# Patient Record
Sex: Male | Born: 1995 | Race: White | Hispanic: No | Marital: Single
Health system: Southern US, Community
[De-identification: ages and names within clinical notes are randomized; demographics above are authoritative.]

---
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
## 2019-01-11 ENCOUNTER — Other Ambulatory Visit: Payer: Self-pay | Admitting: Neurosurgery

## 2019-01-11 DIAGNOSIS — M5136 Other intervertebral disc degeneration, lumbar region: Secondary | ICD-10-CM

## 2019-01-27 ENCOUNTER — Ambulatory Visit
Admission: RE | Admit: 2019-01-27 | Discharge: 2019-01-27 | Disposition: A | Discharge status: Home / Self Care | Payer: Self-pay | Source: Ambulatory Visit | Attending: Neurosurgery | Admitting: Neurosurgery

## 2019-01-27 ENCOUNTER — Other Ambulatory Visit: Payer: Self-pay

## 2019-01-27 DIAGNOSIS — M5136 Other intervertebral disc degeneration, lumbar region: Secondary | ICD-10-CM

## 2019-06-19 ENCOUNTER — Other Ambulatory Visit: Payer: Self-pay | Admitting: Neurosurgery

## 2019-06-19 DIAGNOSIS — M5416 Radiculopathy, lumbar region: Secondary | ICD-10-CM

## 2019-06-27 ENCOUNTER — Other Ambulatory Visit (HOSPITAL_COMMUNITY): Payer: Self-pay | Admitting: Neurosurgery

## 2019-06-27 DIAGNOSIS — M5416 Radiculopathy, lumbar region: Secondary | ICD-10-CM

## 2019-07-09 ENCOUNTER — Other Ambulatory Visit: Payer: Self-pay

## 2019-07-09 ENCOUNTER — Ambulatory Visit (HOSPITAL_COMMUNITY)
Admission: RE | Admit: 2019-07-09 | Discharge: 2019-07-09 | Disposition: A | Discharge status: Home / Self Care | Payer: BC Managed Care – PPO | Source: Ambulatory Visit | Attending: Neurosurgery | Admitting: Neurosurgery

## 2019-07-09 DIAGNOSIS — M5416 Radiculopathy, lumbar region: Secondary | ICD-10-CM | POA: Insufficient documentation

## 2019-07-09 MED ORDER — GADOBUTROL 1 MMOL/ML IV SOLN
10.0000 mL | Freq: Once | INTRAVENOUS | Status: AC | PRN
  Administered 2019-07-09: 10 mL via INTRAVENOUS

## 2019-07-15 ENCOUNTER — Other Ambulatory Visit: Payer: PRIVATE HEALTH INSURANCE

## 2019-11-04 ENCOUNTER — Other Ambulatory Visit: Payer: Self-pay | Admitting: Neurosurgery

## 2019-11-04 DIAGNOSIS — M5416 Radiculopathy, lumbar region: Secondary | ICD-10-CM

## 2019-12-02 ENCOUNTER — Ambulatory Visit
Admission: RE | Admit: 2019-12-02 | Discharge: 2019-12-02 | Disposition: A | Discharge status: Home / Self Care | Payer: BC Managed Care – PPO | Source: Ambulatory Visit | Attending: Neurosurgery | Admitting: Neurosurgery

## 2019-12-02 DIAGNOSIS — M5416 Radiculopathy, lumbar region: Secondary | ICD-10-CM

## 2019-12-02 MED ORDER — GADOBENATE DIMEGLUMINE 529 MG/ML IV SOLN
20.0000 mL | Freq: Once | INTRAVENOUS | Status: AC | PRN
  Administered 2019-12-02: 20 mL via INTRAVENOUS

## 2020-12-29 ENCOUNTER — Encounter: Payer: Self-pay | Admitting: Occupational Therapy

## 2020-12-29 ENCOUNTER — Ambulatory Visit: Payer: BC Managed Care – PPO | Attending: Orthopedic Surgery | Admitting: Occupational Therapy

## 2020-12-29 DIAGNOSIS — M654 Radial styloid tenosynovitis [de Quervain]: Secondary | ICD-10-CM | POA: Diagnosis not present

## 2020-12-29 DIAGNOSIS — M25632 Stiffness of left wrist, not elsewhere classified: Secondary | ICD-10-CM | POA: Diagnosis present

## 2020-12-29 DIAGNOSIS — M25532 Pain in left wrist: Secondary | ICD-10-CM | POA: Insufficient documentation

## 2020-12-29 DIAGNOSIS — M79642 Pain in left hand: Secondary | ICD-10-CM | POA: Diagnosis present

## 2020-12-29 DIAGNOSIS — M25642 Stiffness of left hand, not elsewhere classified: Secondary | ICD-10-CM | POA: Diagnosis present

## 2020-12-29 NOTE — Therapy (Signed)
[facility] [address] [place], [address] Phone: [phone]   Fax:  [phone]  Occupational Therapy Evaluation  Patient Details  Name: Bradley Garrett MRN: [id number] Date of Birth: 11-04-1995 Referring Provider (OT): Bradley Beech PA   Encounter Date: 12/29/2020   OT End of Session - 12/29/20 1922     Visit Number 1    Number of Visits 10    Date for OT Re-Evaluation 02/09/21    OT Start Time 1034    OT Stop Time 1124    OT Time Calculation (min) 50 min    Activity Tolerance Patient tolerated treatment well    Behavior During Therapy [name] for tasks assessed/performed             History reviewed. No pertinent past medical history.  History reviewed. No pertinent surgical history.  There were no vitals filed for this visit.   Subjective Assessment - 12/29/20 1749     Subjective  I had the wrist and hand pain on and off for the last 31yrs-  the last month to 2 months worse - wearing the thumb splint    Pertinent History Bradley Garrett is a 25 y.o. male that presents at [facility] 12/18/20  for initial evaluation and management of right wrist pain.     His pain began around a month ago. The pain is located over the radial aspect of the wrist. He describes his pain as dull at rest and sharp with certain movements of the wrist. It is aggravated by lifting anything with the hand, any twisting with the wrist or gripping with the hand.He has a slight dull pain at rest today. He denies associated numbness or tingling, denies upper extremity weakness. He has tried bracing the wrist and thumb which has helped some with the thumb pain in the past. Refer to OT    Currently in Pain? 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 , play games on computer and phone , cook ,Read      AROM   Right Wrist Extension 66 Degrees    Right Wrist Flexion 90 Degrees    Right Wrist Radial Deviation 23 Degrees    Right Wrist Ulnar Deviation 30 Degrees   pain   Left Wrist Radial Deviation 35 Degrees    Left Wrist Ulnar Deviation 40 Degrees      Right Hand AROM   R Thumb MCP 0-60 40 Degrees   L 40   R Thumb IP 0-80 30 Degrees   L 80   R Thumb Radial ABduction/ADduction 0-55 52    R Thumb Palmar ABduction/ADduction 0-45 52  L 62   R Thumb Opposition to Index --   Opposition pain to 4th and 5th                     HEP contrast 2 x day  Thumb spica most all the time - off for ADL's and pain free AROM for wrist flexion, ext, RD, UD  10 reps Thumb PA and RA  And opposition pain free  10 reps  Can do ice massage over distal radius head several times during day         OT Education - 12/29/20 1922     Education Details Findings of eval, HEP and splint wearing    Person(s) Educated Patient    Methods Explanation;Demonstration;Tactile cues;Verbal cues;Handout    Comprehension Verbal cues required;Returned demonstration;Verbalized understanding                 OT Long Term Goals - 12/29/20 1928       OT LONG TERM GOAL #1   Title Pt  pain in L hand and wrist  on PRWHE decrease with more than 20 points    Baseline pain on PRWHE at eval 34/50    Time 4    Period Weeks    Status New    Target Date 01/26/21      OT LONG TERM GOAL #2   Title Pt to show increase R thumb and wrist UD AROM  to use in ADL's and  light cooking without increase symptoms    Baseline pain 2-8/10 and thumb spica more than 80% of time on - decrease UD with pain and PA - decrease thumb flexion    Time 5    Period Weeks    Status New    Target Date 02/02/21      OT LONG TERM GOAL #3   Title Pt R grip and prehension increase to more than 60% compare to L hand to wean out of splint wiithout symptoms    Baseline NT_ pain 2-8/10 - thumb spica more than 80% of time -    Time 6    Period Weeks    Status New    Target Date 02/09/21                   Plan - 12/29/20 1924     Clinical Impression Statement Pt present at eval with diagnosis of R non dominant DeQuervain tenosynovitis - pt had it on and off for 2 yrs and could usually improve with splint wearing but had pain now for about 2 months - pain 2-8/10 at R wrist and thumb - pt pt tender over distal radius head and positive Finkelstein. Pain with end range wrist flexion, ext - limited in UD and pain , as well as thumb flexion and PA decrease with pain - fabricated custom forearm base thumb spica for pt to use most all the time, do some contrast and ice massage - pain free ROM and use - request Ionto with dexamethazone - pt limited in use of L hand and wrist in ADL's and IADL's - pt can benefit from skilled OT services    OT Occupational Profile and History Problem Focused Assessment - Including review of records relating to presenting problem    Occupational performance deficits (Please refer to evaluation for details): ADL's;IADL's;Work;Play;Leisure;Social Participation    Body Structure / Function / Physical Skills ADL;Coordination;Flexibility;Decreased knowledge of precautions;IADL;ROM;UE functional use;Pain;Dexterity;Strength    Rehab Potential Fair  Clinical Decision Making Limited treatment options, no task modification necessary    Comorbidities Affecting Occupational Performance: None    Modification or Assistance to Complete Evaluation  No modification of  tasks or assist necessary to complete eval    OT Frequency 2x / week    OT Duration 6 weeks    OT Treatment/Interventions Self-care/ADL training;Manual Therapy;Patient/family education;Passive range of motion;Iontophoresis;Cryotherapy;Therapeutic exercise;Therapeutic activities;Contrast Bath;Fluidtherapy;DME and/or AE instruction;Splinting    Consulted and Agree with Plan of Care Patient             Patient will benefit from skilled therapeutic intervention in order to improve the following deficits and impairments:   Body Structure / Function / Physical Skills: ADL, Coordination, Flexibility, Decreased knowledge of precautions, IADL, ROM, UE functional use, Pain, Dexterity, Strength       Visit Diagnosis: Radial styloid tenosynovitis (de quervain) - Plan: Ot plan of care cert/re-cert  Pain in left hand - Plan: Ot plan of care cert/re-cert  Pain in left wrist - Plan: Ot plan of care cert/re-cert  Stiffness of left hand, not elsewhere classified - Plan: Ot plan of care cert/re-cert  Stiffness of left wrist, not elsewhere classified - Plan: Ot plan of care cert/re-cert    Problem List There are no problems to display for this patient.   Oletta Cohn, OTR/L, CLT 12/29/2020, 7:33 PM  [facility] PHYSICAL AND SPORTS MEDICINE [address] [place], [address] Phone: [phone]   Fax:  [phone]  Name: Bradley Garrett MRN: [id number] Date of Birth: 1996/01/15

## 2021-01-05 ENCOUNTER — Ambulatory Visit: Payer: BC Managed Care – PPO | Admitting: Occupational Therapy

## 2021-01-05 DIAGNOSIS — M25642 Stiffness of left hand, not elsewhere classified: Secondary | ICD-10-CM

## 2021-01-05 DIAGNOSIS — M79642 Pain in left hand: Secondary | ICD-10-CM

## 2021-01-05 DIAGNOSIS — M654 Radial styloid tenosynovitis [de Quervain]: Secondary | ICD-10-CM

## 2021-01-05 DIAGNOSIS — M25632 Stiffness of left wrist, not elsewhere classified: Secondary | ICD-10-CM

## 2021-01-05 DIAGNOSIS — M25532 Pain in left wrist: Secondary | ICD-10-CM

## 2021-01-05 NOTE — Therapy (Signed)
[place] [facility]INE [address] [place], [address] Phone: [phone]   Fax:  [phone]  Occupational Therapy Treatment  Patient Details  Name: Bradley Garrett MRN: [id number] Date of Birth: October 10, 1995 Referring Provider (OT): Lasandra Beech PA   Encounter Date: 01/05/2021   OT End of Session - 01/05/21 1134     Visit Number 2    Number of Visits 10    Date for OT Re-Evaluation 02/09/21    OT Start Time 1116    OT Stop Time 1200    OT Time Calculation (min) 44 min    Activity Tolerance Patient tolerated treatment well    Behavior During Therapy [facility] for tasks assessed/performed             No past medical history on file.  No past surgical history on file.  There were no vitals filed for this visit.   Subjective Assessment - 01/05/21 1130     Subjective  Splint  doing okay and pain maybe little better- but still tender , tight and burning pain at times    Pertinent History Bradley Garrett is a 25 y.o. male that presents at [facility] 12/18/20  for initial evaluation and management of right wrist pain.     His pain began around a month ago. The pain is located over the radial aspect of the wrist. He describes his pain as dull at rest and sharp with certain movements of the wrist. It is aggravated by lifting anything with the hand, any twisting with the wrist or gripping with the hand.He has a slight dull pain at rest today. He denies associated numbness or tingling, denies upper extremity weakness. He has tried bracing the wrist and thumb which has helped some with the thumb pain in the past. Refer to OT    Patient Stated Goals Want the pain better so I can use my hand and wrist like before    Currently in Pain? Yes    Pain Score 6     Pain Location Wrist    Pain Orientation Right    Pain Descriptors / Indicators Tender;Burning;Tightness    Pain Type Acute pain    Pain Onset More than a month ago                 [facility] OT Assessment - 01/05/21 0001       Right Hand AROM   R Thumb MCP 0-60 45 Degrees    R Thumb IP 0-80 75 Degrees    R Thumb Radial ABduction/ADduction 0-55 52    R Thumb Palmar ABduction/ADduction 0-45 55   pain   R Thumb Opposition to Index --   oppostiion to 5th - pain end range           Pt show increase AROM in thumb - pain with  Bradley Garrett 6/10 pain - tenderness 5/10  AROM in ranges that has pain 3/10  UD  most tight        pt ed on ionto and skin check done prior and afterwards- no issues - tolerated well    OT Treatments/Exercises (OP) - 01/05/21 0001       Iontophoresis   Type of Iontophoresis Dexamethasone    Location R distal radius    Dose med patch , 2.0 current    Time 19      RUE Contrast Bath   Time 8 minutes    Comments prior to soft tissue  and ROM               HEP contrast 2 x day to cont with  Soft tissue mobs done to webspace of thumb - tight and graston tool nr 2 for sweeping and brushing over volar and radial forearm and wrist UD AAROM stretch add at edge of table 10 reps- pain free prior to AROM  Thumb spica most all the time - off for ADL's and pain free AROM for wrist flexion, ext, RD, UD  10 reps Thumb PA and RA  And opposition pain free - an slide down 5th  10 reps  Can do ice massage over distal radius head several times during day            OT Education - 01/05/21 1133     Education Details progress and changes to HEP - ionto use    Person(s) Educated Patient    Methods Explanation;Demonstration;Tactile cues;Verbal cues;Handout    Comprehension Verbal cues required;Returned demonstration;Verbalized understanding                 OT Long Term Goals - 12/29/20 1928       OT LONG TERM GOAL #1   Title Pt  pain in L hand and wrist  on PRWHE decrease with more than 20 points    Baseline pain on PRWHE at eval 34/50    Time 4    Period Weeks    Status New    Target Date 01/26/21      OT  LONG TERM GOAL #2   Title Pt to show increase R thumb and wrist UD AROM  to use in ADL's and light cooking without increase symptoms    Baseline pain 2-8/10 and thumb spica more than 80% of time on - decrease UD with pain and PA - decrease thumb flexion    Time 5    Period Weeks    Status New    Target Date 02/02/21      OT LONG TERM GOAL #3   Title Pt R grip and prehension increase to more than 60% compare to L hand to wean out of splint wiithout symptoms    Baseline NT_ pain 2-8/10 - thumb spica more than 80% of time -    Time 6    Period Weeks    Status New    Target Date 02/09/21                   Plan - 01/05/21 1136     Clinical Impression Statement Pt present last week with diagnosis of R non dominant DeQuervain tenosynovitis - pt had it on and off for 2 yrs and could usually improve with splint wearing but had pain now for about 2 months - pain 2-8/10 at R wrist and thumb - pt pt tender over distal radius head and positive Finkelstein. Pain with end range wrist flexion, ext - limited in UD and pain , as well as thumb flexion and PA decrease with pain - fabricated custom forearm base thumb spica for pt to use most all the time, do some contrast and ice massage  Pt come in with pain 3-6/10 wiht Bradley Garrett the worse and tight in UD and thumb composite flexion- did increase thumb IP and MC flexion this date - ionto initiated this date - pt to cont with same HEP but add AAROM for UD pain free  - pain free ROM and use - pt limited in use of  L hand and wrist in ADL's and IADL's - pt can benefit from skilled OT services    OT Occupational Profile and History Problem Focused Assessment - Including review of records relating to presenting problem    Occupational performance deficits (Please refer to evaluation for details): ADL's;IADL's;Work;Play;Leisure;Social Participation    Body Structure / Function / Physical Skills ADL;Coordination;Flexibility;Decreased knowledge of  precautions;IADL;ROM;UE functional use;Pain;Dexterity;Strength    Rehab Potential Fair    Clinical Decision Making Limited treatment options, no task modification necessary    Comorbidities Affecting Occupational Performance: None    Modification or Assistance to Complete Evaluation  No modification of tasks or assist necessary to complete eval    OT Frequency 2x / week    OT Duration 6 weeks    OT Treatment/Interventions Self-care/ADL training;Manual Therapy;Patient/family education;Passive range of motion;Iontophoresis;Cryotherapy;Therapeutic exercise;Therapeutic activities;Contrast Bath;Fluidtherapy;DME and/or AE instruction;Splinting    Consulted and Agree with Plan of Care Patient             Patient will benefit from skilled therapeutic intervention in order to improve the following deficits and impairments:   Body Structure / Function / Physical Skills: ADL, Coordination, Flexibility, Decreased knowledge of precautions, IADL, ROM, UE functional use, Pain, Dexterity, Strength       Visit Diagnosis: Radial styloid tenosynovitis (de quervain)  Pain in left hand  Pain in left wrist  Stiffness of left hand, not elsewhere classified  Stiffness of left wrist, not elsewhere classified    Problem List There are no problems to display for this patient.   Bradley Garrett, Bradley Garrett,Bradley Garrett 01/05/2021, 12:19 PM  [place] [facility] PHYSICAL AND SPORTS MEDICINE [address], [place], [address] Phone: [phone]   Fax:  [phone]  Name: Bradley Garrett MRN: [id number] Date of Birth: 1995/10/22

## 2021-01-07 ENCOUNTER — Ambulatory Visit: Payer: BC Managed Care – PPO | Admitting: Occupational Therapy

## 2021-01-07 DIAGNOSIS — M25532 Pain in left wrist: Secondary | ICD-10-CM

## 2021-01-07 DIAGNOSIS — M25632 Stiffness of left wrist, not elsewhere classified: Secondary | ICD-10-CM

## 2021-01-07 DIAGNOSIS — M654 Radial styloid tenosynovitis [de Quervain]: Secondary | ICD-10-CM | POA: Diagnosis not present

## 2021-01-07 DIAGNOSIS — M25642 Stiffness of left hand, not elsewhere classified: Secondary | ICD-10-CM

## 2021-01-07 DIAGNOSIS — M79642 Pain in left hand: Secondary | ICD-10-CM

## 2021-01-07 NOTE — Therapy (Signed)
[facility] PH[facility]ORTS MEDICINE [address] [place], [address] Phone: [phone]   Fax:  [phone]  Occupational Therapy Treatment  Patient Details  Name: Bradley Garrett MRN: [id number] Date of Birth: 1996/01/26 Referring Provider (OT): Lasandra Beech PA   Encounter Date: 01/07/2021   OT End of Session - 01/07/21 1134     Visit Number 3    Number of Visits 10    Date for OT Re-Evaluation 02/09/21    OT Start Time 1128    OT Stop Time 1206    OT Time Calculation (min) 38 min             No past medical history on file.  No past surgical history on file.  There were no vitals filed for this visit.   Subjective Assessment - 01/07/21 1132     Subjective  About the same -not as tender -  I forgot the side bend exercise for the wrist    Pertinent History Bradley Garrett is a 25 y.o. male that presents at [facility] 12/18/20  for initial evaluation and management of right wrist pain.     His pain began around a month ago. The pain is located over the radial aspect of the wrist. He describes his pain as dull at rest and sharp with certain movements of the wrist. It is aggravated by lifting anything with the hand, any twisting with the wrist or gripping with the hand.He has a slight dull pain at rest today. He denies associated numbness or tingling, denies upper extremity weakness. He has tried bracing the wrist and thumb which has helped some with the thumb pain in the past. Refer to OT    Patient Stated Goals Want the pain better so I can use my hand and wrist like before    Currently in Pain? Yes    Pain Score 5    Finkelstein 5/10 ; tenderness 4/10   Pain Location Wrist    Pain Orientation Right    Pain Descriptors / Indicators Tender;Tightness    Pain Type Acute pain    Pain Onset More than a month ago                  Pt show increase AROM in thumb  and wrist without pain   Finkelstein 5/10 pain - tenderness  4/10  AROM  for wrist flexion, ext , RD and thumb PA and RA  - with no pain  UD  most tight and composite thumb flexion with wrist UD               pt ed on ionto and skin check done prior and afterwards- no issues - tolerated well           OT Treatments/Exercises (OP) - 01/07/21 0001       Iontophoresis   Type of Iontophoresis Dexamethasone    Location R distal radius    Dose med patch , 2.0 current    Time 19      RUE Contrast Bath   Time 8 minutes    Comments prior to ROM and soft tissue            HEP contrast 2 x day to cont with  Soft tissue mobs done to webspace of thumb - tight and graston tool nr 2 for sweeping and brushing over volar and radial forearm and wrist UD AAROM stretch add at edge of table 10 reps-  pain free prior to AROM - pt forgot since last time  Composite flexion of thumb PROM  Then UD with thumb ADD- PROM /AAROM  Thumb spica most all the time - off for ADL's and pain free AROM for wrist flexion, ext, RD, UD  10 reps Thumb PA and RA  And opposition pain free - an slide down 5th  10 reps  Can do ice massage over distal radius head several times during day         OT Education - 01/07/21 1134     Education Details progress and changes to HEP - ionto use    Person(s) Educated Patient    Methods Explanation;Demonstration;Tactile cues;Verbal cues;Handout    Comprehension Verbal cues required;Returned demonstration;Verbalized understanding                 OT Long Term Goals - 12/29/20 1928       OT LONG TERM GOAL #1   Title Pt  pain in L hand and wrist  on PRWHE decrease with more than 20 points    Baseline pain on PRWHE at eval 34/50    Time 4    Period Weeks    Status New    Target Date 01/26/21      OT LONG TERM GOAL #2   Title Pt to show increase R thumb and wrist UD AROM  to use in ADL's and light cooking without increase symptoms    Baseline pain 2-8/10 and thumb spica more than 80% of time on - decrease UD with  pain and PA - decrease thumb flexion    Time 5    Period Weeks    Status New    Target Date 02/02/21      OT LONG TERM GOAL #3   Title Pt R grip and prehension increase to more than 60% compare to L hand to wean out of splint wiithout symptoms    Baseline NT_ pain 2-8/10 - thumb spica more than 80% of time -    Time 6    Period Weeks    Status New    Target Date 02/09/21                   Plan - 01/07/21 1156     Clinical Impression Statement Pt present at eval with  diagnosis of R non dominant DeQuervain tenosynovitis - pt had it on and off for 2 yrs and could usually improve with splint wearing but had pain now for about 2 months - pain 2-8/10 at R wrist and thumb - pt pt tender over distal radius head and positive Finkelstein. Pain with end range wrist flexion, ext - limited in UD and pain , as well as thumb flexion and PA decrease with pain - fabricated custom forearm base thumb spica for pt to use most all the time, do some contrast and ice massage  Pt come in with pain 3-5/10 wiht Lourena Simmonds the worse and tight in UD and thumb composite flexion- did increase thumb IP and MC flexion this date - and during session had increase thumb flexion and UD - ionto  2nd session today  - pt to cont with same HEP but add AAROM for UD pain free  - pain free ROM and use - pt limited in use of L hand and wrist in ADL's and IADL's - pt can benefit from skilled OT services    OT Occupational Profile and History Problem Focused Assessment - Including review of  records relating to presenting problem    Occupational performance deficits (Please refer to evaluation for details): ADL's;IADL's;Work;Play;Leisure;Social Participation    Body Structure / Function / Physical Skills ADL;Coordination;Flexibility;Decreased knowledge of precautions;IADL;ROM;UE functional use;Pain;Dexterity;Strength    Rehab Potential Fair    Clinical Decision Making Limited treatment options, no task modification necessary     Comorbidities Affecting Occupational Performance: None    Modification or Assistance to Complete Evaluation  No modification of tasks or assist necessary to complete eval    OT Frequency 2x / week    OT Duration 6 weeks    OT Treatment/Interventions Self-care/ADL training;Manual Therapy;Patient/family education;Passive range of motion;Iontophoresis;Cryotherapy;Therapeutic exercise;Therapeutic activities;Contrast Bath;Fluidtherapy;DME and/or AE instruction;Splinting    Consulted and Agree with Plan of Care Patient             Patient will benefit from skilled therapeutic intervention in order to improve the following deficits and impairments:   Body Structure / Function / Physical Skills: ADL, Coordination, Flexibility, Decreased knowledge of precautions, IADL, ROM, UE functional use, Pain, Dexterity, Strength       Visit Diagnosis: Radial styloid tenosynovitis (de quervain)  Pain in left hand  Pain in left wrist  Stiffness of left hand, not elsewhere classified  Stiffness of left wrist, not elsewhere classified    Problem List There are no problems to display for this patient.   Oletta Cohn, OTR/L,CLT 01/07/2021, 12:08 PM   [facility] PHYSICAL AND SPORTS MEDICINE [address], [place], [address] Phone: [phone]   Fax:  [phone]  Name: WILHO SHARPLEY MRN: [id number] Date of Birth: 1996-01-03

## 2021-01-12 ENCOUNTER — Ambulatory Visit: Payer: BC Managed Care – PPO | Attending: Orthopedic Surgery | Admitting: Occupational Therapy

## 2021-01-12 DIAGNOSIS — M79642 Pain in left hand: Secondary | ICD-10-CM | POA: Diagnosis present

## 2021-01-12 DIAGNOSIS — M25642 Stiffness of left hand, not elsewhere classified: Secondary | ICD-10-CM | POA: Diagnosis present

## 2021-01-12 DIAGNOSIS — M654 Radial styloid tenosynovitis [de Quervain]: Secondary | ICD-10-CM

## 2021-01-12 DIAGNOSIS — M25532 Pain in left wrist: Secondary | ICD-10-CM | POA: Diagnosis present

## 2021-01-12 DIAGNOSIS — M25632 Stiffness of left wrist, not elsewhere classified: Secondary | ICD-10-CM

## 2021-01-12 NOTE — Therapy (Deleted)
[place] [facility] PHYSICAL [facility] SPORTS MEDICINE [address], [place], [address] Phone: [phone]   Fax:  [phone]  Occupational Therapy Treatment  Patient Details  Name: Bradley Garrett MRN: [id number] Date of Birth: 1996/03/29 Referring Provider (OT): Lasandra Beech PA   Encounter Date: 01/12/2021    No past medical history on file.  No past surgical history on file.  There were no vitals filed for this visit.                              OT Long Term Goals - 12/29/20 1928       OT LONG TERM GOAL #1   Title Pt  pain in L hand and wrist  on PRWHE decrease with more than 20 points    Baseline pain on PRWHE at eval 34/50    Time 4    Period Weeks    Status New    Target Date 01/26/21      OT LONG TERM GOAL #2   Title Pt to show increase R thumb and wrist UD AROM  to use in ADL's and light cooking without increase symptoms    Baseline pain 2-8/10 and thumb spica more than 80% of time on - decrease UD with pain and PA - decrease thumb flexion    Time 5    Period Weeks    Status New    Target Date 02/02/21      OT LONG TERM GOAL #3   Title Pt R grip and prehension increase to more than 60% compare to L hand to wean out of splint wiithout symptoms    Baseline NT_ pain 2-8/10 - thumb spica more than 80% of time -    Time 6    Period Weeks    Status New    Target Date 02/09/21                    Patient will benefit from skilled therapeutic intervention in order to improve the following deficits and impairments:           Visit Diagnosis: No diagnosis found.    Problem List There are no problems to display for this patient.   Oletta Cohn, OT/L 01/12/2021, 11:03 AM  [place] [facility] PHYSICAL AND SPORTS MEDICINE [address] [place], [address] Phone: [phone]   Fax:  [phone]  Name: Bradley Garrett MRN: [id number] Date of  Birth: [birth year]

## 2021-01-12 NOTE — Therapy (Signed)
[facility] PH[facility]INE [address], [place], [address] Phone: [phone]   Fax:  [phone]  Occupational Therapy Treatment  Patient Details  Name: Bradley Garrett MRN: [id number] Date of Birth: 1996-01-02 Referring Provider (OT): Lasandra Beech PA   Encounter Date: 01/12/2021   OT End of Session - 01/12/21 1111     Visit Number 4    Number of Visits 10    Date for OT Re-Evaluation 02/09/21    OT Start Time 1038    OT Stop Time 1119    OT Time Calculation (min) 41 min    Activity Tolerance Patient tolerated treatment well    Behavior During Therapy [facility] for tasks assessed/performed             No past medical history on file.  No past surgical history on file.  There were no vitals filed for this visit.   Subjective Assessment - 01/12/21 1110     Subjective  Doing better - did not do that wrist stretch like I should have - still tight- but feeling better    Pertinent History Bradley Garrett is a 25 y.o. male that presents at [facility] 12/18/20  for initial evaluation and management of right wrist pain.     His pain began around a month ago. The pain is located over the radial aspect of the wrist. He describes his pain as dull at rest and sharp with certain movements of the wrist. It is aggravated by lifting anything with the hand, any twisting with the wrist or gripping with the hand.He has a slight dull pain at rest today. He denies associated numbness or tingling, denies upper extremity weakness. He has tried bracing the wrist and thumb which has helped some with the thumb pain in the past. Refer to OT    Patient Stated Goals Want the pain better so I can use my hand and wrist like before    Currently in Pain? Yes    Pain Score 3     Pain Location Wrist    Pain Orientation Right    Pain Descriptors / Indicators Tender;Tightness    Pain Type Acute pain    Pain Onset More than a month ago    Pain Frequency  Intermittent                                      Pt show increase AROM in thumb  and wrist without pain   Finkelstein and distal radius head  tenderness 3/10  AROM  for wrist flexion, ext , RD and thumb PA and RA  - with no pain  UD  most tight and composite thumb flexion with wrist UD   Done some soft tissue graston on volar and radial forearm and wrist  Joint mobs to DRUJ and thumb CMC -    HEP contrast 2 x day to cont with  Soft tissue mobs done to webspace of thumb - tight and graston tool nr 2 for sweeping and brushing over volar and radial forearm and wrist UD AAROM stretch add at edge of table 10 reps- pain free prior to AROM - pt forgot since last time  Composite flexion of thumb PROM  Then UD with thumb ADD- PROM /AAROM  Thumb spica most all the time - off for ADL's and pain free AROM for wrist flexion, ext, RD,  UD  10 reps Thumb PA and RA  And opposition pain free - an slide down 5th  10 reps  Can do ice massage over distal radius head several times during day            pt ed on ionto and skin check done prior and afterwards- no issues - tolerated well        OT Treatments/Exercises (OP) - 01/12/21 0001       Iontophoresis   Type of Iontophoresis Dexamethasone    Location R distal radius    Dose med patch , 2.0 current    Time 31                    OT Education - 01/12/21 1111     Education Details progress and changes to HEP - ionto use    Person(s) Educated Patient    Methods Explanation;Demonstration;Tactile cues;Verbal cues;Handout    Comprehension Verbal cues required;Returned demonstration;Verbalized understanding                 OT Long Term Goals - 12/29/20 1928       OT LONG TERM GOAL #1   Title Pt  pain in L hand and wrist  on PRWHE decrease with more than 20 points    Baseline pain on PRWHE at eval 34/50    Time 4    Period Weeks    Status New    Target Date 01/26/21      OT LONG TERM GOAL #2    Title Pt to show increase R thumb and wrist UD AROM  to use in ADL's and light cooking without increase symptoms    Baseline pain 2-8/10 and thumb spica more than 80% of time on - decrease UD with pain and PA - decrease thumb flexion    Time 5    Period Weeks    Status New    Target Date 02/02/21      OT LONG TERM GOAL #3   Title Pt R grip and prehension increase to more than 60% compare to L hand to wean out of splint wiithout symptoms    Baseline NT_ pain 2-8/10 - thumb spica more than 80% of time -    Time 6    Period Weeks    Status New    Target Date 02/09/21                   Plan - 01/12/21 1112     Clinical Impression Statement Pt present at eval with  diagnosis of R non dominant DeQuervain tenosynovitis - pt had it on and off for 2 yrs and could usually improve with splint wearing but had pain now for about 2 months - pain 2-8/10 at R wrist and thumb - pt pt tender over distal radius head and positive Finkelstein. Pain with end range wrist flexion, ext - limited in UD and pain , as well as thumb flexion and PA decrease with pain - fabricated custom forearm base thumb spica for pt to use most all the time, do some contrast and ice massage  Pt come in with pain 3/10 wiht Finkelstein  and tenderness over distal radius - cont to be tight in UD and thumb composite flexion- did increase thumb IP and MC flexion- and during session had increase thumb flexion and UD - ionto  3rd session today  - pt to cont with same HEP but add AAROM for UD pain free  -  pain free ROM and use - pt limited in use of L hand and wrist in ADL's and IADL's - pt can benefit from skilled OT services    OT Occupational Profile and History Problem Focused Assessment - Including review of records relating to presenting problem    Occupational performance deficits (Please refer to evaluation for details): ADL's;IADL's;Work;Play;Leisure;Social Participation    Body Structure / Function / Physical Skills  ADL;Coordination;Flexibility;Decreased knowledge of precautions;IADL;ROM;UE functional use;Pain;Dexterity;Strength    Rehab Potential Fair    Clinical Decision Making Limited treatment options, no task modification necessary    Comorbidities Affecting Occupational Performance: None    Modification or Assistance to Complete Evaluation  No modification of tasks or assist necessary to complete eval    OT Frequency 2x / week    OT Duration 6 weeks    OT Treatment/Interventions Self-care/ADL training;Manual Therapy;Patient/family education;Passive range of motion;Iontophoresis;Cryotherapy;Therapeutic exercise;Therapeutic activities;Contrast Bath;Fluidtherapy;DME and/or AE instruction;Splinting    Consulted and Agree with Plan of Care Patient             Patient will benefit from skilled therapeutic intervention in order to improve the following deficits and impairments:   Body Structure / Function / Physical Skills: ADL, Coordination, Flexibility, Decreased knowledge of precautions, IADL, ROM, UE functional use, Pain, Dexterity, Strength       Visit Diagnosis: Radial styloid tenosynovitis (de quervain)  Pain in left hand  Stiffness of left hand, not elsewhere classified  Stiffness of left wrist, not elsewhere classified    Problem List There are no problems to display for this patient.   Oletta Cohn, OTR/L,CLT 01/12/2021, 11:21 AM  [facility] PHYSICAL AND SPORTS MEDICINE [address] [place], [address] Phone: [phone]   Fax:  [phone]  Name: CORMAC WINT MRN: [id number] Date of Birth: 1996-03-29

## 2021-01-12 NOTE — Therapy (Deleted)
[facility] [facility] [address] [place], [address] Phone: [phone]   Fax:  [phone]  Occupational Therapy Treatment  Patient Details  Name: RUDDY SWIRE MRN: [id number] Date of Birth: [birth year] Referring Provider (OT): Lasandra Beech PA   Encounter Date: 01/12/2021    No past medical history on file.  No past surgical history on file.  There were no vitals filed for this visit.                              OT Long Term Goals - 12/29/20 1928       OT LONG TERM GOAL #1   Title Pt  pain in L hand and wrist  on PRWHE decrease with more than 20 points    Baseline pain on PRWHE at eval 34/50    Time 4    Period Weeks    Status New    Target Date 01/26/21      OT LONG TERM GOAL #2   Title Pt to show increase R thumb and wrist UD AROM  to use in ADL's and light cooking without increase symptoms    Baseline pain 2-8/10 and thumb spica more than 80% of time on - decrease UD with pain and PA - decrease thumb flexion    Time 5    Period Weeks    Status New    Target Date 02/02/21      OT LONG TERM GOAL #3   Title Pt R grip and prehension increase to more than 60% compare to L hand to wean out of splint wiithout symptoms    Baseline NT_ pain 2-8/10 - thumb spica more than 80% of time -    Time 6    Period Weeks    Status New    Target Date 02/09/21                    Patient will benefit from skilled therapeutic intervention in order to improve the following deficits and impairments:           Visit Diagnosis: No diagnosis found.    Problem List There are no problems to display for this patient.   Oletta Cohn, OT/L 01/12/2021, 11:07 AM   [facility] PHYSICAL AND SPORTS MEDICINE [address] [place], [address] Phone: [phone]   Fax:  [phone]  Name: TONG PIECZYNSKI MRN: [id number] Date of  Birth: 12/24/1995

## 2021-01-14 ENCOUNTER — Ambulatory Visit: Payer: BC Managed Care – PPO | Admitting: Occupational Therapy

## 2021-01-14 DIAGNOSIS — M25532 Pain in left wrist: Secondary | ICD-10-CM

## 2021-01-14 DIAGNOSIS — M654 Radial styloid tenosynovitis [de Quervain]: Secondary | ICD-10-CM

## 2021-01-14 DIAGNOSIS — M25632 Stiffness of left wrist, not elsewhere classified: Secondary | ICD-10-CM

## 2021-01-14 DIAGNOSIS — M79642 Pain in left hand: Secondary | ICD-10-CM

## 2021-01-14 DIAGNOSIS — M25642 Stiffness of left hand, not elsewhere classified: Secondary | ICD-10-CM

## 2021-01-14 NOTE — Therapy (Signed)
[facility] PHYSICAL [facility]ORTS MEDICINE [address], [place], [address] Phone: [phone]   Fax:  [phone]  Occupational Therapy Treatment  Patient Details  Name: Bradley Garrett MRN: [id number] Date of Birth: 09/09/1995 Referring Provider (OT): Lasandra Beech PA   Encounter Date: 01/14/2021   OT End of Session - 01/14/21 1143     Visit Number 5    Number of Visits 10    Date for OT Re-Evaluation 02/09/21    OT Start Time 1115    OT Stop Time 1200    OT Time Calculation (min) 45 min    Activity Tolerance Patient tolerated treatment well    Behavior During Therapy [facility] for tasks assessed/performed             No past medical history on file.  No past surgical history on file.  There were no vitals filed for this visit.   Subjective Assessment - 01/14/21 1116     Subjective  Feels better- I did not realize I was tight in my wrist bending my wrist towards to pinkie    Pertinent History Bradley Garrett is a 25 y.o. male that presents at [facility] 12/18/20  for initial evaluation and management of right wrist pain.     His pain began around a month ago. The pain is located over the radial aspect of the wrist. He describes his pain as dull at rest and sharp with certain movements of the wrist. It is aggravated by lifting anything with the hand, any twisting with the wrist or gripping with the hand.He has a slight dull pain at rest today. He denies associated numbness or tingling, denies upper extremity weakness. He has tried bracing the wrist and thumb which has helped some with the thumb pain in the past. Refer to OT    Patient Stated Goals Want the pain better so I can use my hand and wrist like before    Currently in Pain? Yes    Pain Score 2     Pain Location Wrist    Pain Orientation Right    Pain Descriptors / Indicators Tender;Tightness    Pain Type Acute pain    Pain Onset More than a month ago    Pain Frequency  Occasional                [facility] OT Assessment - 01/14/21 0001       Strength   Right Hand Grip (lbs) 85    Right Hand Lateral Pinch 27 lbs    Right Hand 3 Point Pinch 20 lbs    Left Hand Grip (lbs) 120    Left Hand Lateral Pinch 27 lbs    Left Hand 3 Point Pinch 21 lbs            Grip and prehension strength assess - no pain -  AROM in thumb  and wrist without pain  except composite thumb flexion ,and RD of wrist with resistance - 1-2/10  Finkelstein negative  distal radius head  tenderness 2/10  UD  most tight and composite thumb flexion with wrist UD   Done some soft tissue graston on volar and radial forearm and wrist with some thumb MC flexion , composite thumb flexion and UD      HEP contrast 2 x day to cont with  UD AAROM stretch add at edge of table 10 reps- pain free prior to AROM  Composite flexion of thumb PROM  Then UD with thumb ADD- PROM /AAROM  Thumb spica most all the time - off for ADL's and pain free AROM for wrist flexion, ext, RD, UD  10 reps Thumb PA and RA  And opposition pain free - an slide down 5th  10 reps  Can do ice massage over distal radius head several times during day            pt ed on ionto and skin check done prior and afterwards- no issues - tolerated well           OT Treatments/Exercises (OP) - 01/14/21 0001       Iontophoresis   Type of Iontophoresis Dexamethasone    Location R distal radius    Dose med patch , 2.0 current    Time 19      RUE Contrast Bath   Time 8 minutes    Comments prior to soft tissue and stretches                    OT Education - 01/14/21 1143     Education Details progress and changes to HEP - ionto use    Person(s) Educated Patient    Methods Explanation;Demonstration;Tactile cues;Verbal cues;Handout    Comprehension Verbal cues required;Returned demonstration;Verbalized understanding                 OT Long Term Goals - 12/29/20 1928       OT LONG TERM  GOAL #1   Title Pt  pain in L hand and wrist  on PRWHE decrease with more than 20 points    Baseline pain on PRWHE at eval 34/50    Time 4    Period Weeks    Status New    Target Date 01/26/21      OT LONG TERM GOAL #2   Title Pt to show increase R thumb and wrist UD AROM  to use in ADL's and light cooking without increase symptoms    Baseline pain 2-8/10 and thumb spica more than 80% of time on - decrease UD with pain and PA - decrease thumb flexion    Time 5    Period Weeks    Status New    Target Date 02/02/21      OT LONG TERM GOAL #3   Title Pt R grip and prehension increase to more than 60% compare to L hand to wean out of splint wiithout symptoms    Baseline NT_ pain 2-8/10 - thumb spica more than 80% of time -    Time 6    Period Weeks    Status New    Target Date 02/09/21                   Plan - 01/14/21 1144     Clinical Impression Statement Pt presented at eval with  diagnosis of R non dominant DeQuervain tenosynovitis - pt had it on and off for 2 yrs and could usually improve with splint wearing, pain for about  2 months - pain 2-8/10 at R wrist and thumb - tender over distal radius head and positive Finkelstein. Pain with end range wrist flexion, ext - limited in UD and pain , as well as thumb flexion and PA decrease with pain - at eval  fabricated custom forearm base thumb spica for pt to use most all the time, do some contrast and ice massage  Today pain decrease to 2/10 with tenderness over distal radius  and FInkelstein no pain but very tight still  in UD and thumb composite flexion- no pain with wrist AROM in all planes and thumbs - Grip decrease compare to the L but prehension WNL -  did increase thumb IP and MC flexion- tightness probably from favoring and splint wearing at times over the years when ahd symptoms. Had today 4th ionto session   - pt to cont with same HEP but add AAROM for UD pain free  - pain free ROM and use - pt limited in use of L hand and  wrist in ADL's and IADL's - pt can benefit from skilled OT services    OT Occupational Profile and History Problem Focused Assessment - Including review of records relating to presenting problem    Occupational performance deficits (Please refer to evaluation for details): ADL's;IADL's;Work;Play;Leisure;Social Participation    Body Structure / Function / Physical Skills ADL;Coordination;Flexibility;Decreased knowledge of precautions;IADL;ROM;UE functional use;Pain;Dexterity;Strength    Rehab Potential Fair    Clinical Decision Making Limited treatment options, no task modification necessary    Comorbidities Affecting Occupational Performance: None    Modification or Assistance to Complete Evaluation  No modification of tasks or assist necessary to complete eval    OT Frequency 2x / week    OT Duration 6 weeks    OT Treatment/Interventions Self-care/ADL training;Manual Therapy;Patient/family education;Passive range of motion;Iontophoresis;Cryotherapy;Therapeutic exercise;Therapeutic activities;Contrast Bath;Fluidtherapy;DME and/or AE instruction;Splinting    Consulted and Agree with Plan of Care Patient             Patient will benefit from skilled therapeutic intervention in order to improve the following deficits and impairments:   Body Structure / Function / Physical Skills: ADL, Coordination, Flexibility, Decreased knowledge of precautions, IADL, ROM, UE functional use, Pain, Dexterity, Strength       Visit Diagnosis: Radial styloid tenosynovitis (de quervain)  Pain in left hand  Stiffness of left hand, not elsewhere classified  Stiffness of left wrist, not elsewhere classified  Pain in left wrist    Problem List There are no problems to display for this patient.   Bradley Garrett, OTR/L,CLT 01/14/2021, 11:49 AM  [facility] PHYSICAL AND SPORTS MEDICINE [address], [place], [address] Phone: [phone]   Fax:   [phone]  Name: Bradley Garrett MRN: [id number] Date of Birth: 02-03-1996

## 2021-01-18 ENCOUNTER — Ambulatory Visit: Payer: BC Managed Care – PPO | Admitting: Occupational Therapy

## 2021-01-18 DIAGNOSIS — M654 Radial styloid tenosynovitis [de Quervain]: Secondary | ICD-10-CM | POA: Diagnosis not present

## 2021-01-18 DIAGNOSIS — M79642 Pain in left hand: Secondary | ICD-10-CM

## 2021-01-18 DIAGNOSIS — M25532 Pain in left wrist: Secondary | ICD-10-CM

## 2021-01-18 DIAGNOSIS — M25632 Stiffness of left wrist, not elsewhere classified: Secondary | ICD-10-CM

## 2021-01-18 DIAGNOSIS — M25642 Stiffness of left hand, not elsewhere classified: Secondary | ICD-10-CM

## 2021-01-18 NOTE — Therapy (Signed)
[place] [facility] PH[facility] SPORTS MEDICINE [address], [place], [address] Phone: [phone]   Fax:  [phone]  Occupational Therapy Treatment  Patient Details  Name: Bradley Garrett MRN: [id number] Date of Birth: 06/18/1995 Referring Provider (OT): Lasandra Beech PA   Encounter Date: 01/18/2021   OT End of Session - 01/18/21 1433     Visit Number 6    Number of Visits 10    Date for OT Re-Evaluation 02/09/21    OT Start Time 1406    OT Stop Time 1450    OT Time Calculation (min) 44 min    Activity Tolerance Patient tolerated treatment well    Behavior During Therapy [facility] for tasks assessed/performed             No past medical history on file.  No past surgical history on file.  There were no vitals filed for this visit.   Subjective Assessment - 01/18/21 1411     Subjective  I do sleep with splint - but did not had it on today- this weekend did wear it- only felt my pain when I picked up pack of mountain dew    Pertinent History Bradley Garrett is a 25 y.o. male that presents at [facility] 12/18/20  for initial evaluation and management of right wrist pain.     His pain began around a month ago. The pain is located over the radial aspect of the wrist. He describes his pain as dull at rest and sharp with certain movements of the wrist. It is aggravated by lifting anything with the hand, any twisting with the wrist or gripping with the hand.He has a slight dull pain at rest today. He denies associated numbness or tingling, denies upper extremity weakness. He has tried bracing the wrist and thumb which has helped some with the thumb pain in the past. Refer to OT    Patient Stated Goals Want the pain better so I can use my hand and wrist like before    Currently in Pain? Yes    Pain Score 4     Pain Location Wrist    Pain Orientation Right    Pain Descriptors / Indicators Tender;Tightness    Pain Type Acute pain    Pain Onset More  than a month ago                          OT Treatments/Exercises (OP) - 01/18/21 0001       Moist Heat Therapy   Number Minutes Moist Heat 6 Minutes    Moist Heat Location Wrist;Hand      Iontophoresis   Type of Iontophoresis Dexamethasone    Location R distal radius    Dose med patch , 2.0 current    Time 19              HEP contrast/heat 2 x day to cont with  Soft tissue mobs done to webspace of thumb - tight  into composite thumb flexion with UD - done graston tool nr 2 for sweeping and brushing over volar and radial forearm and wrist UD AAROM stretch edge of table 10 reps- pain free prior to AROM  Then add thumb Flexion and then combo - did show some improvement - with less pain  Thumb spica most all the time - off for ADL's and pain free AROM for wrist flexion, ext, RD, UD  And take off night  time - will assess if still pain free  10 reps Thumb PA and RA  And opposition pain free - an slide down 5th  10 reps  Can do ice massage over distal radius head several times during day         OT Education - 01/18/21 1433     Education Details progress and changes to HEP - ionto use    Person(s) Educated Patient    Methods Explanation;Demonstration;Tactile cues;Verbal cues;Handout    Comprehension Verbal cues required;Returned demonstration;Verbalized understanding                 OT Long Term Goals - 12/29/20 1928       OT LONG TERM GOAL #1   Title Pt  pain in L hand and wrist  on PRWHE decrease with more than 20 points    Baseline pain on PRWHE at eval 34/50    Time 4    Period Weeks    Status New    Target Date 01/26/21      OT LONG TERM GOAL #2   Title Pt to show increase R thumb and wrist UD AROM  to use in ADL's and light cooking without increase symptoms    Baseline pain 2-8/10 and thumb spica more than 80% of time on - decrease UD with pain and PA - decrease thumb flexion    Time 5    Period Weeks    Status New    Target  Date 02/02/21      OT LONG TERM GOAL #3   Title Pt R grip and prehension increase to more than 60% compare to L hand to wean out of splint wiithout symptoms    Baseline NT_ pain 2-8/10 - thumb spica more than 80% of time -    Time 6    Period Weeks    Status New    Target Date 02/09/21                   Plan - 01/18/21 1434     Clinical Impression Statement Pt presented at eval with  diagnosis of R non dominant DeQuervain tenosynovitis - pt had it on and off for 2 yrs and could usually improve with splint wearing, pain for about  2 months - pain 2-8/10 at R wrist and thumb - tender over distal radius head and positive Finkelstein. Pain with end range wrist flexion, ext - limited in UD and pain , as well as thumb flexion and PA decrease with pain - at eval  fabricated custom forearm base thumb spica for pt to use most all the time, do some contrast and ice massage last week pain decrease to 2/10 but today increase to 4/10 - did not wear his splint today much -and report picking up pack of [place] yesterday and had some pain- Pt cont to be very  tight still  in UD and thumb composite flexion- no pain with wrist AROM in all planes and thumbs - Grip decrease compare to the L but prehension WNL -  tightness probably from favoring and splint wearing at times over the years when ahd symptoms. Had today 5th ionto session    after doing soft tissue and mobs show increase composite UD  with slight pop per pt - but less pain- pt to cont with same HEP but add AAROM for UD pain free  - pain free ROM and use - pt limited in use of L hand and wrist  in ADL's and IADL's - pt can benefit from skilled OT services    OT Occupational Profile and History Problem Focused Assessment - Including review of records relating to presenting problem    Occupational performance deficits (Please refer to evaluation for details): ADL's;IADL's;Work;Play;Leisure;Social Participation    Body Structure / Function / Physical  Skills ADL;Coordination;Flexibility;Decreased knowledge of precautions;IADL;ROM;UE functional use;Pain;Dexterity;Strength    Rehab Potential Fair    Clinical Decision Making Limited treatment options, no task modification necessary    Comorbidities Affecting Occupational Performance: None    Modification or Assistance to Complete Evaluation  No modification of tasks or assist necessary to complete eval    OT Frequency 2x / week    OT Duration 6 weeks    OT Treatment/Interventions Self-care/ADL training;Manual Therapy;Patient/family education;Passive range of motion;Iontophoresis;Cryotherapy;Therapeutic exercise;Therapeutic activities;Contrast Bath;Fluidtherapy;DME and/or AE instruction;Splinting    Consulted and Agree with Plan of Care Patient             Patient will benefit from skilled therapeutic intervention in order to improve the following deficits and impairments:   Body Structure / Function / Physical Skills: ADL, Coordination, Flexibility, Decreased knowledge of precautions, IADL, ROM, UE functional use, Pain, Dexterity, Strength       Visit Diagnosis: Radial styloid tenosynovitis (de quervain)  Pain in left hand  Stiffness of left hand, not elsewhere classified  Stiffness of left wrist, not elsewhere classified  Pain in left wrist    Problem List There are no problems to display for this patient.   Oletta Cohn, OTR/L,CLT 01/18/2021, 2:53 PM  [place] [facility] PHYSICAL AND SPORTS MEDICINE [address] [place], [address] Phone: [phone]   Fax:  [phone]  Name: Bradley Garrett MRN: [id number] Date of Birth: November 05, 1995

## 2021-01-20 ENCOUNTER — Encounter: Payer: Self-pay | Admitting: Occupational Therapy

## 2021-01-20 ENCOUNTER — Ambulatory Visit: Payer: BC Managed Care – PPO | Admitting: Occupational Therapy

## 2021-01-20 DIAGNOSIS — M25632 Stiffness of left wrist, not elsewhere classified: Secondary | ICD-10-CM

## 2021-01-20 DIAGNOSIS — M25532 Pain in left wrist: Secondary | ICD-10-CM

## 2021-01-20 DIAGNOSIS — M654 Radial styloid tenosynovitis [de Quervain]: Secondary | ICD-10-CM

## 2021-01-20 DIAGNOSIS — M79642 Pain in left hand: Secondary | ICD-10-CM

## 2021-01-20 DIAGNOSIS — M25642 Stiffness of left hand, not elsewhere classified: Secondary | ICD-10-CM

## 2021-01-20 NOTE — Therapy (Signed)
[place] [facility] PH[facility]ORTS MEDICINE [address], [place], [address] Phone: [phone]   Fax:  [phone]  Occupational Therapy Treatment  Patient Details  Name: Bradley Garrett MRN: [id number] Date of Birth: Feb 21, 1996 Referring Provider (OT): Lasandra Beech PA   Encounter Date: 01/20/2021   OT End of Session - 01/21/21 1550     Visit Number 7    Number of Visits 10    Date for OT Re-Evaluation 02/09/21    OT Start Time 1115    OT Stop Time 1205    OT Time Calculation (min) 50 min    Activity Tolerance Patient tolerated treatment well    Behavior During Therapy [facility] for tasks assessed/performed             History reviewed. No pertinent past medical history.  History reviewed. No pertinent surgical history.  There were no vitals filed for this visit.   Subjective Assessment - 01/20/21 1115     Subjective  Pt reports things are going OK, "I think the brace is helping with pain".  Did exercises under warm water and felt better at the time but was still painful afterwards. Taking care of grandparent's dog right now.  Prefers cold over heat.  Does computer work most of the day, filling out information for quality control.  Works 20 hours a week or more.  Exercises going well, has stiffness, has not been wearing brace at night in the last few nights.  Wears brace most of the day right now.  In school currently doing writing and journalism.    Pertinent History Bradley Garrett is a 25 y.o. male that presents at [facility] 12/18/20  for initial evaluation and management of right wrist pain.     His pain began around a month ago. The pain is located over the radial aspect of the wrist. He describes his pain as dull at rest and sharp with certain movements of the wrist. It is aggravated by lifting anything with the hand, any twisting with the wrist or gripping with the hand.He has a slight dull pain at rest today. He denies associated  numbness or tingling, denies upper extremity weakness. He has tried bracing the wrist and thumb which has helped some with the thumb pain in the past. Refer to OT    Patient Stated Goals Want the pain better so I can use my hand and wrist like before    Currently in Pain? Yes    Pain Score 4     Pain Location Wrist    Pain Orientation Right    Pain Descriptors / Indicators Tender;Tightness    Pain Type Acute pain    Pain Onset More than a month ago    Pain Frequency Intermittent    Multiple Pain Sites No              Contrast performed this date to left hand and wrist for 11 mins prior to therapeutic exercises and manual skills  Manual therapy with Soft tissue mobs performed to webspace of thumb, tight into composite thumb flexion with UD,  graston performed with tool nr 2 for sweeping and brushing over volar and radial forearm and wrist  UD AAROM stretch edge of table 10 reps- pain free prior to AROM  Thumb Flexion and then combo, continues to show some improvement with less pain  Thumb spica most all the time, can remove for ADL's and pain free AROM for wrist flexion, ext,  RD, UD  Remove for night time - will assess if still pain free  10 reps Thumb PA and RA  And opposition pain free, with slide down 5th to base of small finger 10 reps   Can do ice massage over distal radius head several times during day  HEP contrast/heat 2 x day            OT Treatments/Exercises (OP) - 01/21/21 0001       Moist Heat Therapy   Moist Heat Location Wrist;Hand      Iontophoresis   Type of Iontophoresis Dexamethasone    Location R distal radius    Dose med patch , 2.0 current    Time 19      RUE Contrast Bath   Time 11 minutes    Comments prior to soft tissue                    OT Education - 01/21/21 1550     Education Details changes to HEP, ionto    Person(s) Educated Patient    Methods Explanation;Demonstration;Tactile cues;Verbal cues;Handout     Comprehension Verbal cues required;Returned demonstration;Verbalized understanding                 OT Long Term Goals - 12/29/20 1928       OT LONG TERM GOAL #1   Title Pt  pain in L hand and wrist  on PRWHE decrease with more than 20 points    Baseline pain on PRWHE at eval 34/50    Time 4    Period Weeks    Status New    Target Date 01/26/21      OT LONG TERM GOAL #2   Title Pt to show increase R thumb and wrist UD AROM  to use in ADL's and light cooking without increase symptoms    Baseline pain 2-8/10 and thumb spica more than 80% of time on - decrease UD with pain and PA - decrease thumb flexion    Time 5    Period Weeks    Status New    Target Date 02/02/21      OT LONG TERM GOAL #3   Title Pt R grip and prehension increase to more than 60% compare to L hand to wean out of splint wiithout symptoms    Baseline NT_ pain 2-8/10 - thumb spica more than 80% of time -    Time 6    Period Weeks    Status New    Target Date 02/09/21                   Plan - 01/21/21 1551     Clinical Impression Statement Pt with 4/10 pain this date.   Pt cont to be very tight still  in UD and thumb composite flexion- no pain with wrist AROM in all planes and thumbs - Grip decrease compare to the L but prehension WNL. Had today 6th ionto session    after doing soft tissue and mobs show increase composite UD, but less pain- pt to cont with same HEP but add AAROM for UD pain free  - pain free ROM and use.  Pt continues to benefit from skilled OT services to maximize safety and independence in necessary daily tasks.    OT Occupational Profile and History Problem Focused Assessment - Including review of records relating to presenting problem    Occupational performance deficits (Please refer to evaluation for details): ADL's;IADL's;Work;Play;Leisure;Social  Participation    Body Structure / Function / Physical Skills ADL;Coordination;Flexibility;Decreased knowledge of  precautions;IADL;ROM;UE functional use;Pain;Dexterity;Strength    Rehab Potential Fair    Clinical Decision Making Limited treatment options, no task modification necessary    Comorbidities Affecting Occupational Performance: None    Modification or Assistance to Complete Evaluation  No modification of tasks or assist necessary to complete eval    OT Frequency 2x / week    OT Duration 6 weeks    OT Treatment/Interventions Self-care/ADL training;Manual Therapy;Patient/family education;Passive range of motion;Iontophoresis;Cryotherapy;Therapeutic exercise;Therapeutic activities;Contrast Bath;Fluidtherapy;DME and/or AE instruction;Splinting    Consulted and Agree with Plan of Care Patient             Patient will benefit from skilled therapeutic intervention in order to improve the following deficits and impairments:   Body Structure / Function / Physical Skills: ADL, Coordination, Flexibility, Decreased knowledge of precautions, IADL, ROM, UE functional use, Pain, Dexterity, Strength       Visit Diagnosis: Radial styloid tenosynovitis (de quervain)  Pain in left hand  Stiffness of left hand, not elsewhere classified  Stiffness of left wrist, not elsewhere classified  Pain in left wrist    Problem List There are no problems to display for this patient.  Bradley Garrett Bradley Garrett, OTR/L, CLT  Vint Pola, OT/L 01/21/2021, 4:03 PM  [place] [facility] PHYSICAL AND SPORTS MEDICINE [address] [place], [address] Phone: [phone]   Fax:  [phone]  Name: Bradley Garrett MRN: [id number] Date of Birth: [birth year]

## 2021-01-25 ENCOUNTER — Ambulatory Visit: Payer: BC Managed Care – PPO | Admitting: Occupational Therapy

## 2021-01-25 DIAGNOSIS — M25642 Stiffness of left hand, not elsewhere classified: Secondary | ICD-10-CM

## 2021-01-25 DIAGNOSIS — M654 Radial styloid tenosynovitis [de Quervain]: Secondary | ICD-10-CM

## 2021-01-25 DIAGNOSIS — M25532 Pain in left wrist: Secondary | ICD-10-CM

## 2021-01-25 DIAGNOSIS — M79642 Pain in left hand: Secondary | ICD-10-CM

## 2021-01-25 DIAGNOSIS — M25632 Stiffness of left wrist, not elsewhere classified: Secondary | ICD-10-CM

## 2021-01-25 NOTE — Therapy (Signed)
[place] [facility] PHYS[facility] SPORTS MEDICINE [address], [place], [address] Phone: [phone]   Fax:  [phone]  Occupational Therapy Treatment  Patient Details  Name: Bradley Garrett MRN: [id number] Date of Birth: December 26, 1995 Referring Provider (OT): Lasandra Beech PA   Encounter Date: 01/25/2021   OT End of Session - 01/25/21 1701     Visit Number 8    Number of Visits 10    Date for OT Re-Evaluation 02/09/21    OT Start Time 1630    OT Stop Time 1715    OT Time Calculation (min) 45 min    Activity Tolerance Patient tolerated treatment well    Behavior During Therapy [facility] for tasks assessed/performed             No past medical history on file.  No past surgical history on file.  There were no vitals filed for this visit.   Subjective Assessment - 01/25/21 1659     Subjective  I had my brace on for about 60% of the time - taking care of my grandparents dogs - but I would say pain is not as intense and not as often - about 50% better - did stop wearing splnt night time -but feels little more touchy    Pertinent History Bradley Garrett is a 25 y.o. male that presents at [facility] 12/18/20  for initial evaluation and management of right wrist pain.     His pain began around a month ago. The pain is located over the radial aspect of the wrist. He describes his pain as dull at rest and sharp with certain movements of the wrist. It is aggravated by lifting anything with the hand, any twisting with the wrist or gripping with the hand.He has a slight dull pain at rest today. He denies associated numbness or tingling, denies upper extremity weakness. He has tried bracing the wrist and thumb which has helped some with the thumb pain in the past. Refer to OT    Patient Stated Goals Want the pain better so I can use my hand and wrist like before    Currently in Pain? Yes    Pain Score 3     Pain Location Wrist    Pain Orientation Right     Pain Descriptors / Indicators Tender;Tightness    Pain Type Acute pain    Pain Onset More than a month ago    Pain Frequency Intermittent                   HEP contrast/heat 2 x day to cont with  Soft tissue mobs done to webspace of thumb - tight  into composite thumb flexion with UD - done graston tool nr 2 for sweeping and brushing over volar and radial forearm and wrist Thumb spica most all the time - off for ADL's and pain free AROM for wrist flexion, ext, RD, UD  Pt to wear it at night time again- feels irritated since last time stopped 10 reps Thumb PA and RA  And opposition pain free - an slide down 5th  10 reps  Can do ice massage over distal radius head several times during day      tolerate ionto well - no issues - skin check done prior - pt to keep on hour afterwards    OT Treatments/Exercises (OP) - 01/25/21 0001       Iontophoresis   Type of Iontophoresis Dexamethasone  Location R distal radius    Dose med patch , 2.0 current    Time 19      RUE Contrast Bath   Time 8 minutes    Comments prior to soft tissue mobs                    OT Education - 01/25/21 1701     Education Details changes to HEP, ionto    Person(s) Educated Patient    Methods Explanation;Demonstration;Tactile cues;Verbal cues;Handout    Comprehension Verbal cues required;Returned demonstration;Verbalized understanding                 OT Long Term Goals - 12/29/20 1928       OT LONG TERM GOAL #1   Title Pt  pain in L hand and wrist  on PRWHE decrease with more than 20 points    Baseline pain on PRWHE at eval 34/50    Time 4    Period Weeks    Status New    Target Date 01/26/21      OT LONG TERM GOAL #2   Title Pt to show increase R thumb and wrist UD AROM  to use in ADL's and light cooking without increase symptoms    Baseline pain 2-8/10 and thumb spica more than 80% of time on - decrease UD with pain and PA - decrease thumb flexion    Time 5     Period Weeks    Status New    Target Date 02/02/21      OT LONG TERM GOAL #3   Title Pt R grip and prehension increase to more than 60% compare to L hand to wean out of splint wiithout symptoms    Baseline NT_ pain 2-8/10 - thumb spica more than 80% of time -    Time 6    Period Weeks    Status New    Target Date 02/09/21                   Plan - 01/25/21 1701     Clinical Impression Statement Pt with 2-4/10 pain this date stopped wearing thumb spica at night time - feels like will do better wearing it - pain about 50% better - no as intense or constant per pt.  Pt cont to be very tight still  in UD and thumb composite flexion-  but could be because of years having this issue and favoring it - Pain with resistance end range thumb PA and RA - but 1/10. Pt today with 7th session of ionto-  after doing soft tissue and mobs show increase composite UD, but less pain- pt to cont with same HEP but add AAROM for UD pain free  - pain free ROM and use.  Pt continues to benefit from skilled OT services to maximize safety and independence in necessary daily tasks.    OT Occupational Profile and History Problem Focused Assessment - Including review of records relating to presenting problem    Occupational performance deficits (Please refer to evaluation for details): ADL's;IADL's;Work;Play;Leisure;Social Participation    Body Structure / Function / Physical Skills ADL;Coordination;Flexibility;Decreased knowledge of precautions;IADL;ROM;UE functional use;Pain;Dexterity;Strength    Rehab Potential Fair    Clinical Decision Making Limited treatment options, no task modification necessary    Comorbidities Affecting Occupational Performance: None    Modification or Assistance to Complete Evaluation  No modification of tasks or assist necessary to complete eval    OT Frequency 2x /  week    OT Duration 4 weeks    OT Treatment/Interventions Self-care/ADL training;Manual Therapy;Patient/family  education;Passive range of motion;Iontophoresis;Cryotherapy;Therapeutic exercise;Therapeutic activities;Contrast Bath;Fluidtherapy;DME and/or AE instruction;Splinting    Consulted and Agree with Plan of Care Patient             Patient will benefit from skilled therapeutic intervention in order to improve the following deficits and impairments:   Body Structure / Function / Physical Skills: ADL, Coordination, Flexibility, Decreased knowledge of precautions, IADL, ROM, UE functional use, Pain, Dexterity, Strength       Visit Diagnosis: Radial styloid tenosynovitis (de quervain)  Pain in left hand  Stiffness of left hand, not elsewhere classified  Stiffness of left wrist, not elsewhere classified  Pain in left wrist    Problem List There are no problems to display for this patient.   Oletta Cohn, OTR/L,CLT 01/25/2021, 5:04 PM  [place] [facility] PHYSICAL AND SPORTS MEDICINE [address], [place], [address] Phone: [phone]   Fax:  [phone]  Name: NEVILLE WALSTON MRN: [id number] Date of Birth: March 28, 1996

## 2021-01-27 ENCOUNTER — Ambulatory Visit: Payer: BC Managed Care – PPO | Admitting: Occupational Therapy

## 2021-01-27 DIAGNOSIS — M25642 Stiffness of left hand, not elsewhere classified: Secondary | ICD-10-CM

## 2021-01-27 DIAGNOSIS — M25532 Pain in left wrist: Secondary | ICD-10-CM

## 2021-01-27 DIAGNOSIS — M79642 Pain in left hand: Secondary | ICD-10-CM

## 2021-01-27 DIAGNOSIS — M654 Radial styloid tenosynovitis [de Quervain]: Secondary | ICD-10-CM

## 2021-01-27 DIAGNOSIS — M25632 Stiffness of left wrist, not elsewhere classified: Secondary | ICD-10-CM

## 2021-01-27 NOTE — Therapy (Signed)
[facility] PHYSICAL AND SPORTS MEDICINE [address], [place], [address] Phone: [phone]   Fax:  [phone]  Occupational Therapy Treatment  Patient Details  Name: Bradley Garrett MRN: [id number] Date of Birth: 1995-11-28 Referring Provider (OT): Lasandra Beech PA   Encounter Date: 01/27/2021   OT End of Session - 01/27/21 1921     Visit Number 9    Number of Visits 10    Date for OT Re-Evaluation 02/09/21    OT Start Time 1615    OT Stop Time 1709    OT Time Calculation (min) 54 min    Activity Tolerance Patient tolerated treatment well    Behavior During Therapy [facility] for tasks assessed/performed             No past medical history on file.  No past surgical history on file.  There were no vitals filed for this visit.   Subjective Assessment - 01/27/21 1918     Subjective  Doing okay - about same as last time- when I turn doorknob certain way - or packing and carrying my stuff when I dog sat for grandparents - felt tome pain but not more than 4/10 - about 50% better- I never realize my wrist is tight going that direction - do feel pop at times    Pertinent History Bradley Garrett is a 25 y.o. male that presents at [facility] 12/18/20  for initial evaluation and management of right wrist pain.     His pain began around a month ago. The pain is located over the radial aspect of the wrist. He describes his pain as dull at rest and sharp with certain movements of the wrist. It is aggravated by lifting anything with the hand, any twisting with the wrist or gripping with the hand.He has a slight dull pain at rest today. He denies associated numbness or tingling, denies upper extremity weakness. He has tried bracing the wrist and thumb which has helped some with the thumb pain in the past. Refer to OT    Patient Stated Goals Want the pain better so I can use my hand and wrist like before    Currently in Pain? Yes    Pain Score 2    4  with Lourena Simmonds - but tight   Pain Location Wrist    Pain Orientation Right    Pain Descriptors / Indicators Tender;Tightness    Pain Type Acute pain    Pain Onset More than a month ago    Pain Frequency Intermittent                 cont this date pre and post heat assessment of UD and composite UD with thumb flexion -  32 degrees for UD , but if add thumb flexion 12 degrees without heat and 15 after heat Soft tissue mobs done to webspace of thumb - tight  into composite thumb flexion with UD - done graston tool nr 2 for sweeping and brushing over volar and radial forearm and wrist Thumb spica most all the time -but pt fit with wrist and thumb wrap and pt to start weaning over the next 10 days into wrist wrap 2hrs day then 3 and then 4 hrs - alternating with thumb spica  Cont with same AROM and PROM HEP        OT Treatments/Exercises (OP) - 01/27/21 0001       Moist Heat Therapy   Number Minutes Moist  Heat 6 Minutes    Moist Heat Location Wrist;Hand   prior to UD and composite UD stretch     Iontophoresis   Type of Iontophoresis Dexamethasone    Location 1st dorsal compartment    Dose med patch , 2.0 current    Time 19            skin check done prior and pt to keep patch on four hour afterwards - pt tolerated well ionto        OT Education - 01/27/21 1921     Education Details changes to HEP, ionto    Person(s) Educated Patient    Methods Explanation;Demonstration;Tactile cues;Verbal cues;Handout    Comprehension Verbal cues required;Returned demonstration;Verbalized understanding                 OT Long Term Goals - 01/27/21 1930       OT LONG TERM GOAL #1   Title Pt  pain in L hand and wrist  on PRWHE decrease with more than 20 points    Baseline pain on PRWHE at eval 34/50  NOW - pain decrease to 17/50    Time 2    Period Weeks    Status On-going    Target Date 02/09/21      OT LONG TERM GOAL #2   Title Pt to show increase R thumb  and wrist UD AROM  to use in ADL's and light cooking without increase symptoms    Baseline pain 2-8/10 and thumb spica more than 80% of time on - decrease UD with pain and PA - decrease thumb flexion  NOW increase AROM except UD and composite UD with thumb flexion , pain decrease to 2-4/10    Time 2    Period Weeks    Status On-going    Target Date 02/09/21      OT LONG TERM GOAL #3   Title Pt R grip and prehension increase to more than 60% compare to L hand to wean out of splint wiithout symptoms    Baseline NT_ pain 2-8/10 - thumb spica more than 80% of time -  - NOW NT - will assess next visit    Time 2    Period Weeks    Status On-going    Target Date 02/09/21                   Plan - 01/27/21 1921     Clinical Impression Statement Pt was seen this date for 8th session of ionto - pain decrease from [facility] 8/10 pain to 2-4/10 pain mostly with tenderness and Lourena Simmonds - pt wearing 60% of time custom  thumb spica  - and feels like his about 50% better - pain not as intense or constant per pt.  Pt cont to be very tight/stiff in UD and thumb composite flexion-  no motion with Lourena Simmonds - after stretches and use has some motion - but then he has pop or click at radial wrist - discuss with pt that pain is about the same the last 3-4 sessions -and that would recommend for him to see maybe Dr Landry Mellow for consult - ? diagnostic US because of tightness for composite UD and pop if done PROM or stretches -  could be because of years having this issue and favoring it /wearing splint-  Pain much better for wrist in all planes and thumb - except UD and resistance for RA of thumb - pt will follow up in 10  days and initiate weaning out of thumb spica into neoprene wrist and thumb wrap - and agree to contact PA that refered him to see if agree for appt with Dr Landry Mellow in 2 wks- do want to check his insurance in the meantime - pt to cont with same HEP - pain free ROM and use.  .    OT Occupational  Profile and History Problem Focused Assessment - Including review of records relating to presenting problem    Occupational performance deficits (Please refer to evaluation for details): ADL's;IADL's;Work;Play;Leisure;Social Participation    Body Structure / Function / Physical Skills ADL;Coordination;Flexibility;Decreased knowledge of precautions;IADL;ROM;UE functional use;Pain;Dexterity;Strength    Rehab Potential Fair    Clinical Decision Making Limited treatment options, no task modification necessary    Comorbidities Affecting Occupational Performance: None    Modification or Assistance to Complete Evaluation  No modification of tasks or assist necessary to complete eval    OT Frequency Biweekly    OT Duration 2 weeks    OT Treatment/Interventions Self-care/ADL training;Manual Therapy;Patient/family education;Passive range of motion;Iontophoresis;Cryotherapy;Therapeutic exercise;Therapeutic activities;Contrast Bath;Fluidtherapy;DME and/or AE instruction;Splinting    Consulted and Agree with Plan of Care Patient             Patient will benefit from skilled therapeutic intervention in order to improve the following deficits and impairments:   Body Structure / Function / Physical Skills: ADL, Coordination, Flexibility, Decreased knowledge of precautions, IADL, ROM, UE functional use, Pain, Dexterity, Strength       Visit Diagnosis: Radial styloid tenosynovitis (de quervain)  Pain in left hand  Stiffness of left hand, not elsewhere classified  Stiffness of left wrist, not elsewhere classified  Pain in left wrist    Problem List There are no problems to display for this patient.   Oletta Cohn, OTR/L,CLT 01/27/2021, 7:37 PM  [place] [facility] PHYSICAL AND SPORTS MEDICINE [address] [place], [address] Phone: [phone]   Fax:  [phone]  Name: Bradley Garrett MRN: [id number] Date of Birth: 1996-03-06

## 2021-02-10 ENCOUNTER — Ambulatory Visit: Payer: BC Managed Care – PPO | Attending: Orthopedic Surgery | Admitting: Occupational Therapy

## 2021-02-10 DIAGNOSIS — M25642 Stiffness of left hand, not elsewhere classified: Secondary | ICD-10-CM | POA: Diagnosis present

## 2021-02-10 DIAGNOSIS — M25632 Stiffness of left wrist, not elsewhere classified: Secondary | ICD-10-CM | POA: Insufficient documentation

## 2021-02-10 DIAGNOSIS — M79642 Pain in left hand: Secondary | ICD-10-CM | POA: Diagnosis present

## 2021-02-10 DIAGNOSIS — M25532 Pain in left wrist: Secondary | ICD-10-CM | POA: Diagnosis present

## 2021-02-10 DIAGNOSIS — M654 Radial styloid tenosynovitis [de Quervain]: Secondary | ICD-10-CM | POA: Insufficient documentation

## 2021-02-10 NOTE — Therapy (Signed)
[place] PHYSICAL AND SPORTS MEDICINE [address], [place], [address] Phone: [phone]   Fax:  [phone]  Occupational Therapy Treatment  Patient Details  Name: Bradley Garrett MRN: [id number] Date of Birth: Jun 15, 1995 Referring Provider (OT): Tamala Julian PA   Encounter Date: 02/10/2021   OT End of Session - 02/10/21 1502     Visit Number 10    Number of Visits 10    Date for OT Re-Evaluation 02/10/21    OT Start Time 1401    OT Stop Time 1446    OT Time Calculation (min) 45 min    Activity Tolerance Patient tolerated treatment well    Behavior During Therapy [facility] for tasks assessed/performed             No past medical history on file.  No past surgical history on file.  There were no vitals filed for this visit.   Subjective Assessment - 02/10/21 1457     Subjective  I was doing very well but then I had 2 days that my wrist was hurting for day or 2 while on the computer - if I am aweare of it, it is when I am on the computer - still tight and not able to bend it down that way and if I keep it there, pain increase - wearing the soft splint most of the time since 2 wks ago    Pertinent History Bradley Garrett is a 25 y.o. male that presents at [facility] 12/18/20  for initial evaluation and management of right wrist pain.     His pain began around a month ago. The pain is located over the radial aspect of the wrist. He describes his pain as dull at rest and sharp with certain movements of the wrist. It is aggravated by lifting anything with the hand, any twisting with the wrist or gripping with the hand.He has a slight dull pain at rest today. He denies associated numbness or tingling, denies upper extremity weakness. He has tried bracing the wrist and thumb which has helped some with the thumb pain in the past. Refer to OT    Patient Stated Goals Want the pain better so I can use my hand and wrist like before    Currently in  Pain? Yes    Pain Score 3     Pain Location Wrist    Pain Orientation Right    Pain Descriptors / Indicators Tender;Tightness    Pain Type Chronic pain;Acute pain    Pain Onset More than a month ago    Pain Frequency Intermittent                [facility] OT Assessment - 02/10/21 0001       Strength   Right Hand Grip (lbs) 92   extended arm 95   Right Hand Lateral Pinch 26 lbs    Right Hand 3 Point Pinch 23 lbs    Left Hand Grip (lbs) 110   extended arm 110/   Left Hand Lateral Pinch 26 lbs    Left Hand 3 Point Pinch 23 lbs             AROM in all planes WNL except composite UD with thumb flexion - pain increase if he holds FInkelstein- 3/10  Tenderness 2/10   Last visit 2 wks ago  pre and post heat assessment of UD and composite UD with thumb flexion -  32 degrees for UD ,  but if add thumb flexion 12 degrees without heat and 15 after heat Since 2 wks ago pt wearing  wrist and thumb wrap  Cont with same AROM and PROM HEP  Pt checked his insurance - deductible met and agree for referral to Dr Candelaria Stagers   Message send to Rives           OT Treatments/Exercises (OP) - 02/10/21 0001       Iontophoresis   Type of Iontophoresis Dexamethasone    Location 1st dorsal compartment    Dose med patch , 2.0 current    Time 19                    OT Education - 02/10/21 1502     Education Details recommend appt with Dr Candelaria Stagers for [place] diagnostic , further tx    Person(s) Educated Patient    Methods Explanation;Demonstration;Tactile cues;Verbal cues;Handout    Comprehension Verbal cues required;Returned demonstration;Verbalized understanding                 OT Long Term Goals - 02/10/21 1522       OT LONG TERM GOAL #1   Title Pt  pain in L hand and wrist  on PRWHE decrease with more than 20 points    Baseline pain on PRWHE at eval 34/50  NOW - pain decrease to13/50    Status Achieved      OT LONG TERM GOAL #2   Title Pt to show  increase R thumb and wrist UD AROM  to use in ADL's and light cooking without increase symptoms    Baseline pain 2-8/10 and thumb spica more than 80% of time on - decrease UD with pain and PA - decrease thumb flexion  NOW increase AROM except UD and composite UD with thumb flexion , pain decrease to 2-3/10    Status Partially Met      OT LONG TERM GOAL #3   Title Pt R grip and prehension increase to more than 60% compare to L hand to wean out of splint wiithout symptoms    Baseline at eval  pain 2-8/10 - thumb spica more than 80% of time  - Grip on the L 110 and L 92 lbs  now    Status Achieved                   Plan - 02/10/21 1517     Clinical Impression Statement Pt was seen this date for 9th session of ionto - pain decrease from [facility] 8/10 pain to 1-3/10 pain mostly with tenderness distal radius and Wynn Maudlin - pt  now most of time only in Soft neoprene thumb and wrist wrap - was in thumb spica prior to 2 wks ago  - and feels like his about 50% better - pain not as intense or constant per pt.   BUT pt cont to be very tight/stiff in UD and thumb composite flexion-  no motion with Wynn Maudlin - after stretches and heat has some motion - but then he has pop or click at radial wrist - discuss with pt that pain is about the same the last 3-4 sessions -and that would recommend for him to see maybe Dr Candelaria Stagers for consult - ? diagnostic US because of tightness for composite UD and pop if done PROM or stretches -  could be because of years having this issue and favoring it /wearing splint-  Pain much better for wrist  in all planes and thumb - except UD and resistance for RA of thumb - Did reach out to PA that refered him to OT and he did agree for referral  to Dr Candelaria Stagers  - reach out this date again to make referral please to DR Candelaria Stagers -  Pt's checked his insurance - and met his deductable -  pt to cont with same HEP - pain free ROM and use.  .    OT Occupational Profile and History Problem  Focused Assessment - Including review of records relating to presenting problem    Occupational performance deficits (Please refer to evaluation for details): ADL's;IADL's;Work;Play;Leisure;Social Participation    Body Structure / Function / Physical Skills ADL;Coordination;Flexibility;Decreased knowledge of precautions;IADL;ROM;UE functional use;Pain;Dexterity;Strength    Rehab Potential Fair    Clinical Decision Making Limited treatment options, no task modification necessary    Comorbidities Affecting Occupational Performance: None    Modification or Assistance to Complete Evaluation  No modification of tasks or assist necessary to complete eval    OT Treatment/Interventions Self-care/ADL training;Manual Therapy;Patient/family education;Passive range of motion;Iontophoresis;Cryotherapy;Therapeutic exercise;Therapeutic activities;Contrast Bath;Fluidtherapy;DME and/or AE instruction;Splinting    Consulted and Agree with Plan of Care Patient             Patient will benefit from skilled therapeutic intervention in order to improve the following deficits and impairments:   Body Structure / Function / Physical Skills: ADL, Coordination, Flexibility, Decreased knowledge of precautions, IADL, ROM, UE functional use, Pain, Dexterity, Strength       Visit Diagnosis: Radial styloid tenosynovitis (de quervain)  Pain in left hand  Stiffness of left hand, not elsewhere classified  Stiffness of left wrist, not elsewhere classified  Pain in left wrist    Problem List There are no problems to display for this patient.   Rosalyn Gess, OTR/L,CLT 02/10/2021, 3:39 PM  [place] PHYSICAL AND SPORTS MEDICINE [address] [place], [address] Phone: [phone]   Fax:  [phone]  Name: MOHID FURUYA MRN: [id number] Date of Birth: 1995-07-22

## 2021-02-10 NOTE — Addendum Note (Signed)
Addended by: Oletta Cohn on: 02/10/2021 03:57 PM   Modules accepted: Orders

## 2021-04-21 IMAGING — MR MR LUMBAR SPINE WO/W CM
4 of 8 series · 23 of 48 positions shown · IV contrast (multihance)
Comparison: Intraoperative lumbar radiographs 08/08/2019. Lumbar
MRI 07/09/2019.

CLINICAL DATA: 24-year-old male with 2 prior surgeries, persistent
low back pain radiating to the left leg.

EXAM:
MRI LUMBAR SPINE WITHOUT AND WITH CONTRAST
TECHNIQUE: Multiplanar and multiecho pulse sequences of the lumbar spine were
obtained without and with intravenous contrast.
CONTRAST:  20mL MULTIHANCE GADOBENATE DIMEGLUMINE 529 MG/ML IV SOLN

[Series 4: T1 · sagittal · 4.0mm · 0.59mm/px · 3 of 17 slices shown (1 of 2)]
[im 1/17]
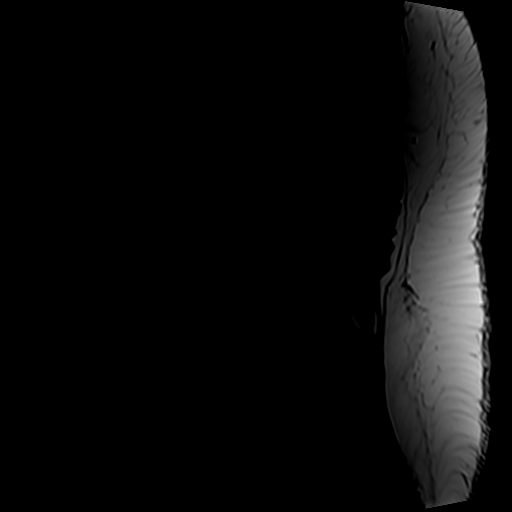
[im 9/17]
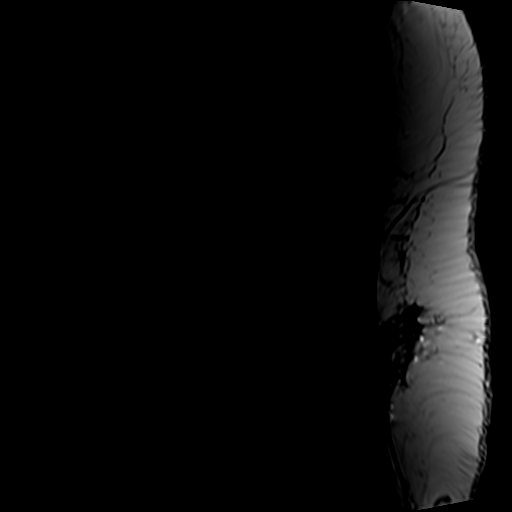
[im 17/17]
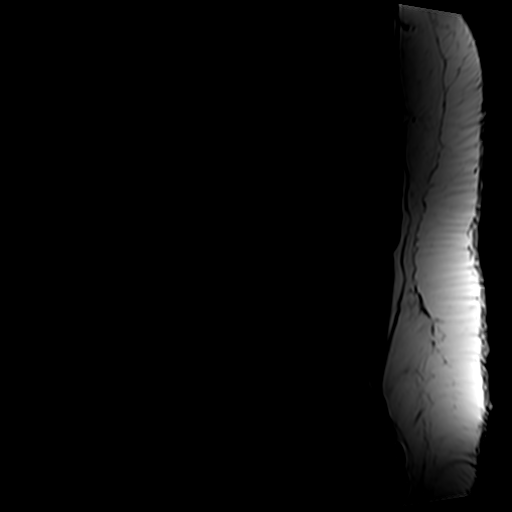

[Series 5: T2 post-contrast · sagittal · 4.0mm · 0.59mm/px · 4 of 17 slices shown]
[im 1/17]
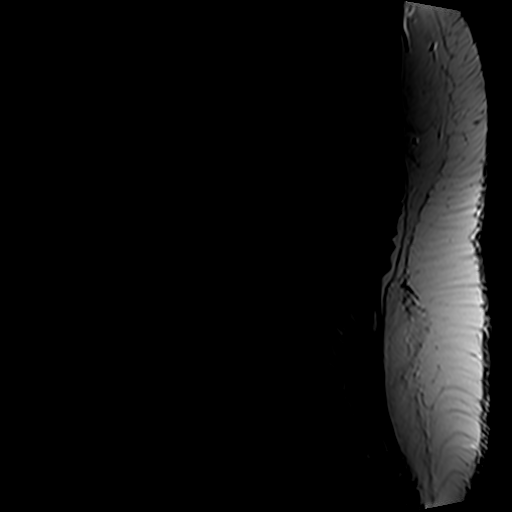
[im 6/17]
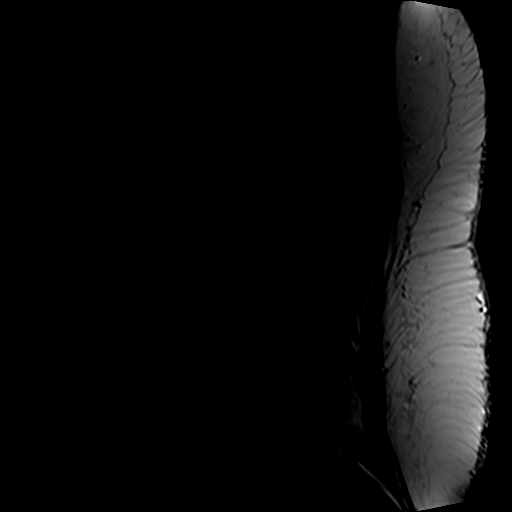
[im 11/17]
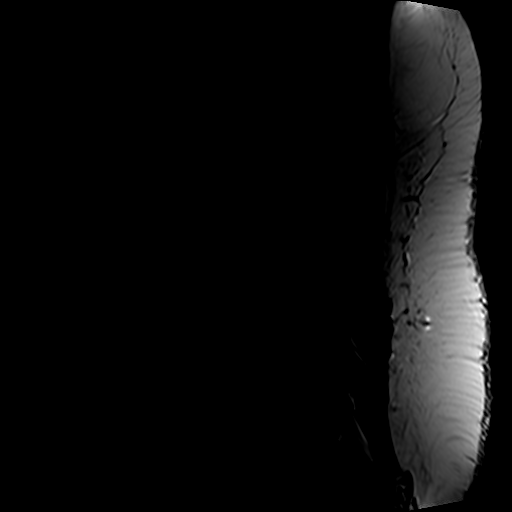
[im 17/17]
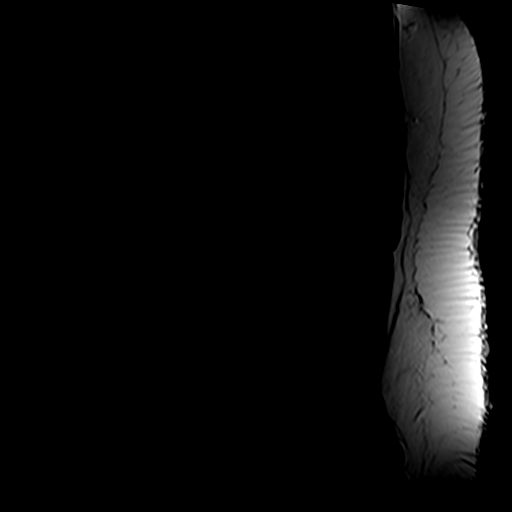

[Series 6: T2 · axial · 4.0mm · 0.74mm/px · z∈[-173,+73]mm · 8 of 48 slices shown]
[im 1/48]
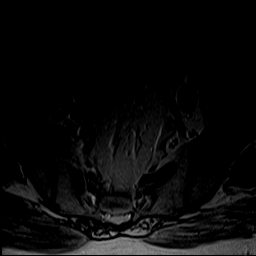
[im 6/48]
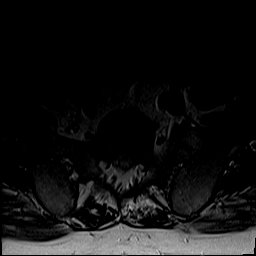
[im 16/48]
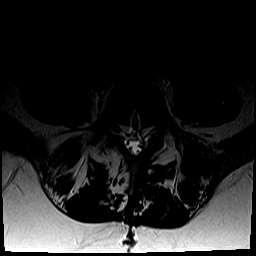
[im 21/48]
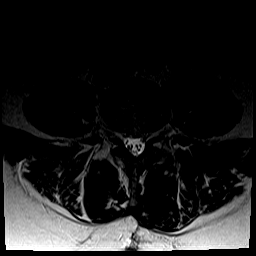
[im 27/48]
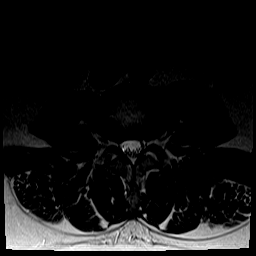
[im 32/48]
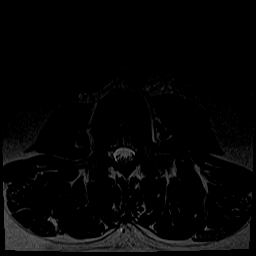
[im 42/48]
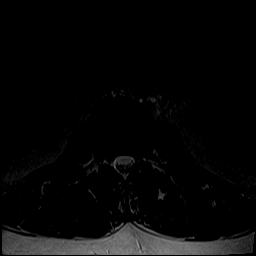
[im 48/48]
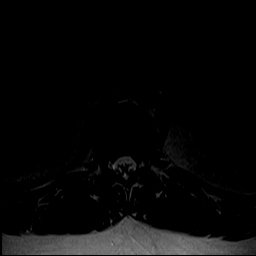

[Series 7: T1 · axial · 4.0mm · 0.37mm/px · z∈[-173,+73]mm · 8 of 48 slices shown (2 of 2)]
[im 1/48]
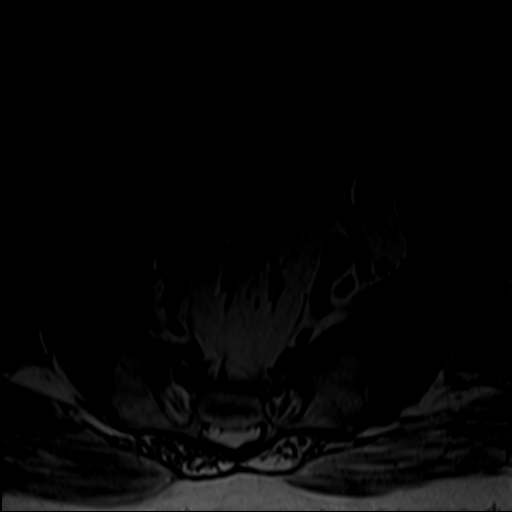
[im 6/48]
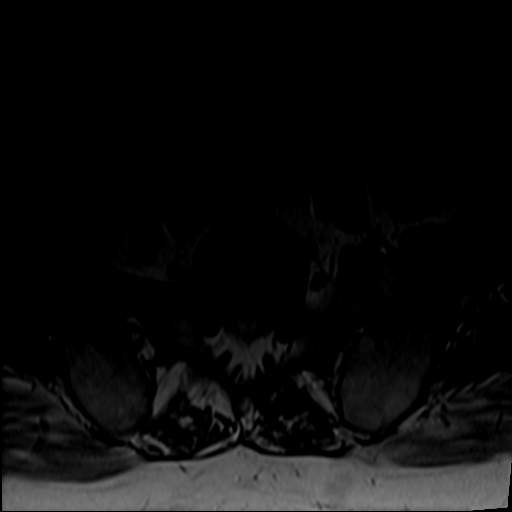
[im 16/48]
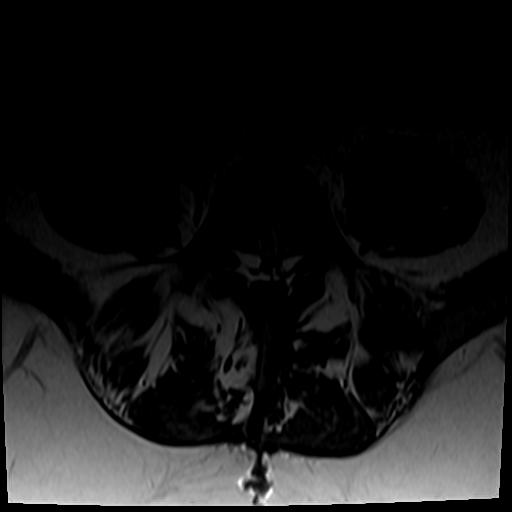
[im 21/48]
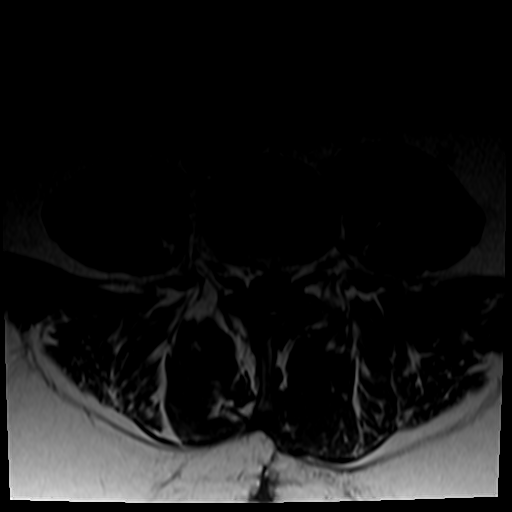
[im 27/48]
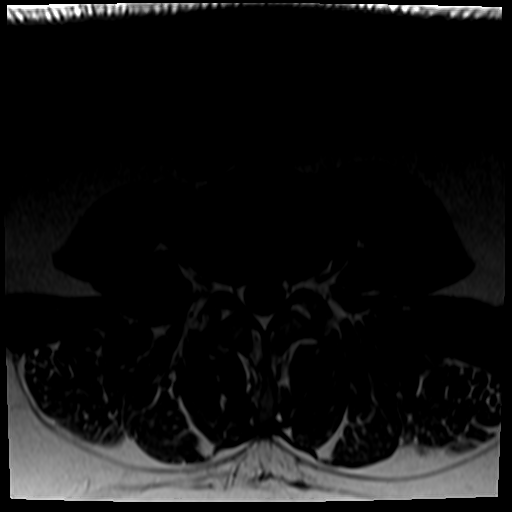
[im 32/48]
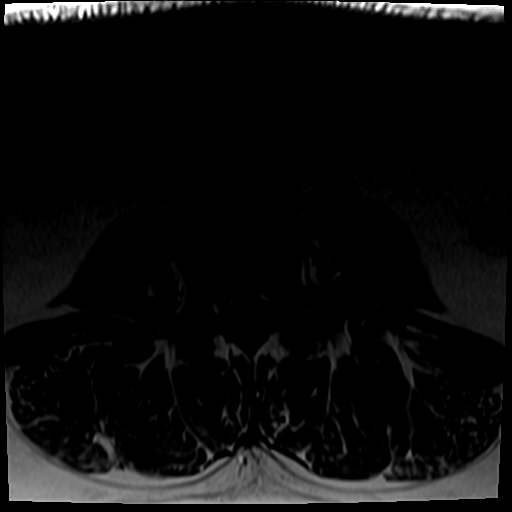
[im 42/48]
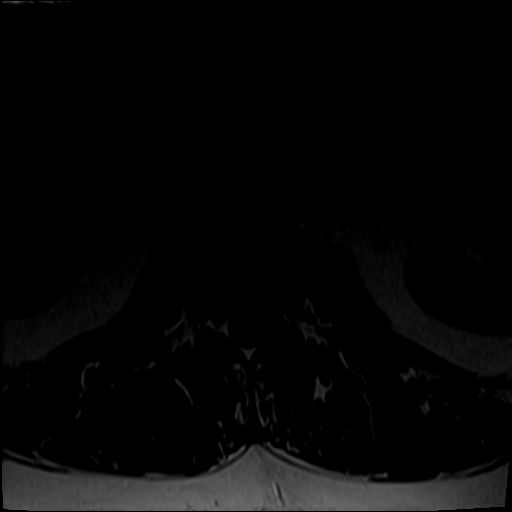
[im 48/48]
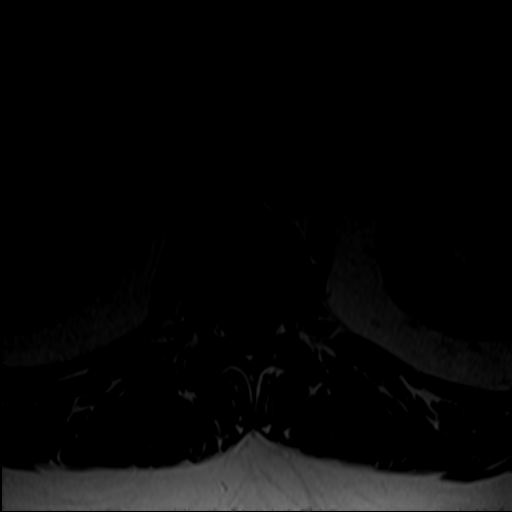

[23 of 48 positions shown; findings below may reference images not displayed]

FINDINGS: Segmentation: Transitional lumbosacral anatomy with the same
numbering system as on the [REDACTED] MRI designating a vestigial S1-S2
disc space. Correlation with radiographs is recommended prior to any
operative intervention.

Alignment: Stable vertebral height and alignment with mild
retrolisthesis of L5 on S1 and straightening of lumbar lordosis.

Vertebrae: Faint degenerative endplate marrow edema posteriorly at
L4-L5 and L5-S1. Postoperative changes of both levels detailed
below. Background bone marrow signal remains within normal limits.
Intact visible sacrum and SI joints.

Conus medullaris and cauda equina: Conus extends to the T12-L1
level. No lower spinal cord or conus signal abnormality. No abnormal
intradural enhancement. No dural thickening.

Paraspinal and other soft tissues: Negative visible abdominal
viscera. Postoperative changes to the posterior paraspinal soft
tissues at L4-L5 and L5-S1, no postoperative fluid collection.

Disc levels:

Visible lower thoracic levels through L3-L4 remain stable and
largely negative.

L4-L5: Left laminectomy since [REDACTED] with resolved paracentral disc
herniation and stenosis which was maximal at the left lateral
recess. Enhancing granulation now tissue in the posterior central
disc space and at the left lateral recess now (series 10, image 30).
Residual mild disc bulging, endplate spurring and facet hypertrophy.
No convincing L4 foraminal stenosis.

L5-S1: Chronic postoperative changes including mild architectural
distortion along the left lateral thecal sac and at the left lateral
recess. Residual mild circumferential disc osteophyte complex
appears stable since [REDACTED]. And mild spinal stenosis at this level
is in large part due to epidural fat. Borderline to mild L5
foraminal stenosis appears stable and greater on the right.

S1-S2: Partially lumbarized, otherwise negative.
IMPRESSION: 1. Transitional lumbosacral anatomy with the same numbering system
as on the [REDACTED] MRI designating a transitional S1.

2. Interval postoperative changes at L4-L5 with resolved disc
herniation and stenosis.

3. Stable postoperative appearance of L5-S1, with some superimposed
epidural lipomatosis.

4. No new neural impingement. Visible lower thoracic levels through
L3-L4 remain stable and essentially negative.
